# Patient Record
Sex: Male | Born: 1975 | Race: Black or African American | Hispanic: No | Marital: Married | Smoking: Never smoker
Health system: Southern US, Community
[De-identification: ages and names within clinical notes are randomized; demographics above are authoritative.]

---

## 2007-05-09 ENCOUNTER — Ambulatory Visit: Payer: Self-pay | Admitting: Cardiology

## 2007-05-09 LAB — CONVERTED CEMR LAB
Chloride: 103 meq/L (ref 96–112)
GFR calc non Af Amer: 83 mL/min
Potassium: 3.6 meq/L (ref 3.5–5.1)
Sodium: 141 meq/L (ref 135–145)

## 2007-05-17 ENCOUNTER — Encounter: Payer: Self-pay | Admitting: Cardiology

## 2007-05-17 ENCOUNTER — Ambulatory Visit: Payer: Self-pay

## 2007-06-03 ENCOUNTER — Ambulatory Visit: Payer: Self-pay | Admitting: Cardiology

## 2007-08-30 ENCOUNTER — Encounter (INDEPENDENT_AMBULATORY_CARE_PROVIDER_SITE_OTHER): Payer: Self-pay | Admitting: *Deleted

## 2007-08-30 ENCOUNTER — Ambulatory Visit: Payer: Self-pay | Admitting: Internal Medicine

## 2007-08-30 DIAGNOSIS — R21 Rash and other nonspecific skin eruption: Secondary | ICD-10-CM

## 2007-08-30 LAB — CONVERTED CEMR LAB
Albumin: 4.1 g/dL (ref 3.5–5.2)
Basophils Absolute: 0 10*3/uL (ref 0.0–0.1)
Bilirubin, Direct: 0.1 mg/dL (ref 0.0–0.3)
Cholesterol: 203 mg/dL (ref 0–200)
Creatinine, Ser: 1.1 mg/dL (ref 0.4–1.5)
Direct LDL: 139.3 mg/dL
Eosinophils Absolute: 0.1 10*3/uL (ref 0.0–0.6)
Glucose, Bld: 93 mg/dL (ref 70–99)
HCT: 43 % (ref 39.0–52.0)
Hemoglobin, Urine: NEGATIVE
Hemoglobin: 14.7 g/dL (ref 13.0–17.0)
Leukocytes, UA: NEGATIVE
MCHC: 34.2 g/dL (ref 30.0–36.0)
MCV: 82 fL (ref 78.0–100.0)
Monocytes Absolute: 0.2 10*3/uL (ref 0.2–0.7)
Neutrophils Relative %: 60.3 % (ref 43.0–77.0)
Potassium: 4.2 meq/L (ref 3.5–5.1)
RDW: 12.5 % (ref 11.5–14.6)
Sodium: 138 meq/L (ref 135–145)
TSH: 1.13 microintl units/mL (ref 0.35–5.50)
Total Bilirubin: 0.8 mg/dL (ref 0.3–1.2)
Urobilinogen, UA: 0.2 (ref 0.0–1.0)

## 2011-02-03 NOTE — Assessment & Plan Note (Signed)
[facility]CARE                            CARDIOLOGY OFFICE NOTE   Vincent Gonzalez, Vincent Gonzalez                       MRN:          [id number]  DATE:06/03/2007                            DOB:          05-Sep-1976    REASON FOR VISIT:  Followup cardiac testing.   HISTORY OF PRESENT ILLNESS:  I saw Vincent Gonzalez back in August.  His  history is detailed in the previous note.  He had a history of  intermittent chest discomfort and shortness of breath, although without  any major cardiac risk factors.  I referred him for testing including a  chest x-ray, which was normal, and a BMET demonstrating normal renal  function and potassium given some findings of borderline hypertension.  He had an echocardiogram which showed an ejection fraction of 50% and no  major wall motion abnormalities, or valvular abnormalities, as well as a  followup Myoview which showed no electrocardiographic changes consistent  with ischemia and normal perfusion.  The ejection fraction by that study  was gated at 40%, although this did not correlate with his  echocardiographic study and I wonder if it was accurate.  I reviewed the  results with the patient today.  His blood pressure is elevated on  today's check and today we talked about limiting salt intake and  establishing with a primary care physician for further followup of his  hypertension.   ALLERGIES:  No known drug allergies.   PRESENT MEDICATIONS:  Multivitamin.   REVIEW OF SYSTEMS:  As described in the History of Present Illness.   EXAMINATION:  Blood pressure 123/100, heart rate 58, weight is 199  pounds (last blood pressure 118/86).  The patient is comfortable and in  no acute distress.  No significant interval change in his examination.  This is outlined in the prior note.   IMPRESSION AND RECOMMENDATIONS:  1. Overall reassuring cardiac testing including no definite ischemia      by Myoview and a low normal ejection fraction of  50% with no focal      wall motion abnormalities by echocardiography.  Right ventricular      pressures were normal as well and there were no other major      valvular abnormalities.  It is not entirely clear that the      patient's symptoms are cardiac based on this testing, but I would      recommend establishing with a primary care Lashante Fryberger for better      followup of hypertension.  We talked about some basic diet and      lifestyle modification issues today, but I suspect that he may      ultimately need a medication for this.  His renal function is      normal on screening BMET as is his potassium.  Chest x-ray was also      normal.  It may be worth considering pulmonary function test at      some point if symptoms      persist.  We can plan to see him back on an as needed basis.  2. Will schedule a visit with our primary care division to establish      regular followup.     Jonelle Sidle, MD  Electronically Signed    SGM/MedQ  DD: 06/03/2007  DT: 06/03/2007  Job #: [id number]

## 2011-02-03 NOTE — Assessment & Plan Note (Signed)
[facility]CARE                            CARDIOLOGY OFFICE NOTE   Vincent Gonzalez, Vincent Gonzalez                       MRN:          [id number]  DATE:05/09/2007                            DOB:          01-Dec-1975    REFERRING PHYSICIAN:  Duke Salvia, MD, [facility]   REASON FOR VISIT:  Chest pain.   HISTORY OF PRESENT ILLNESS:  Vincent Gonzalez is a pleasant, 35 year old male  with no reported major medical conditions who presents with history of  recurrent episodes of chest pain over the last six months. He states  that he has experienced a heavy chest discomfort that has occurred  somewhat sporadically and associated with shortness of breath over the  last 6 months. He states that he notices this mainly when he is at work  but has had episodes at home without any clear precipitant. Typically  these episodes are mild to moderate and last approximately 5 minutes  without any specific intervention although he does state that he tries  to relax when these episodes occur. He has had no prior cardiovascular  testing. He was recently seen at an urgent care with a reported sinus  infection and was placed on amoxicillin and Tylenol sinus. He reports 2  sinus infections in the last 6 months. He otherwise had no specific  symptoms. He states that he has had borderline high blood pressure  noticed at work but does not carry a formal diagnosis of hypertension.  He has had no problems with cough, hemoptysis, fevers, chills,  breathlessness with activity or changes in his urine. His  electrocardiogram shows sinus rhythm with R primes in leads V1 and V2  and nonspecific ST-T wave changes. There is no old tracing for  comparison.   ALLERGIES:  No known drug allergies.   CURRENT MEDICATIONS:  Multivitamin, amoxicillin, Tylenol sinus.   PAST MEDICAL HISTORY:  As outlined above. The patient denies any  specific surgeries.   REVIEW OF SYSTEMS:  As described in the history of present  illness. He  has had a recent nonproductive cough, sinus congestion. He does have  seasonal allergies. He also reports a hernia.   FAMILY HISTORY:  Noncontributory for premature cardiovascular disease.  He states that his father is in his mid 26s  with diabetes. He has 2  healthy siblings. His sister-in-law works with Dr. Graciela Husbands in our practice  in the electrophysiology lab.   SOCIAL HISTORY:  The patient is married, he has 2 children. 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 He reports  is job is fairly strenuous requiring fairly consistent lifting, walking  and standing. He denies any tobacco or alcohol use. No illicit substance  use. He does not exercise regularly at this point.   PHYSICAL EXAMINATION:  VITAL SIGNS:  Blood pressure is 118/86, heart  rate is 66.  GENERAL:  This is a normally nourished-appearing male in no acute  distress.  HEENT:  Normal.  NECK:  Supple. No elevated jugular venous pressure or loud bruits. No  thyromegaly is noted.  LUNGS:  Clear  without labored breathing at rest.  CARDIAC:  Reveals a regular rate and rhythm. No loud murmur or S3  gallop. No pericardial rub.  ABDOMEN:  Soft, nontender, normal active bowel sounds, no bruits, no  obvious hepatomegaly.  EXTREMITIES:  Exhibit no significant edema.  SKIN:  Warm and dry. Some hypopigmented areas are noted.  MUSCULOSKELETAL:  No kyphosis is noted.  NEUROPSYCHIATRIC:  The patient is alert and oriented x3, affect is  normal.   IMPRESSION/RECOMMENDATIONS:  1. Six month history of intermittent chest pain and shortness of      breath as outlined in a 35 year old male with no other major      cardiac risk factors based on available information.      Electrocardiogram is nonspecific at this point. He has R primes in      leads V1 and V2, no obvious Brugada pattern, and only nonspecific      ST-T wave changes. He has had no palpitations, dizziness or      syncope. He does  report problems with sinus infection over the      last 6 months but otherwise no cough, hemoptysis or urinary      changes. He has also noted borderline high blood pressure during      checks at work. We discussed the situation today and I have      reviewed with him a number of issues including further diagnostic      testing for ischemic heart disease. We talked about both      noninvasive and invasive techniques today and he seems to be most      comfortable proceeding with a noninvasive test first. I therefore      plan an exercise Myoview and echocardiogram in the near future with      planned followup in the office to discuss the results. I would also      like a BMET to assess his renal function and a basic chest x-ray. I      did not begin any new medications today and will otherwise plan to      see him back to review the results. He was quite comfortable with      this.  2. Further plans to follow.     Jonelle Sidle, MD  Electronically Signed    SGM/MedQ  DD: 05/09/2007  DT: 05/10/2007  Job #: [id number]   cc:   Duke Salvia, MD, [facility]

## 2019-12-07 ENCOUNTER — Ambulatory Visit: Payer: Self-pay | Attending: Family

## 2019-12-07 DIAGNOSIS — Z23 Encounter for immunization: Secondary | ICD-10-CM

## 2019-12-07 NOTE — Progress Notes (Signed)
   Covid-19 Vaccination Clinic  Name:  Oron Westrup    MRN: [id number] DOB: 1975/10/15  12/07/2019  Mr. Capozzoli was observed post Covid-19 immunization for 15 minutes without incident. He was provided with Vaccine Information Sheet and instruction to access the V-Safe system.   Mr. Foskey was instructed to call 911 with any severe reactions post vaccine: Marland Kitchen Difficulty breathing  . Swelling of face and throat  . A fast heartbeat  . A bad rash all over body  . Dizziness and weakness   Immunizations Administered    Name Date Dose VIS Date Route   Moderna COVID-19 Vaccine 12/07/2019  3:16 PM 0.5 mL 08/22/2019 Intramuscular   Manufacturer: Moderna   Lot: [id number]   NDC: 61607-371-06

## 2020-01-09 ENCOUNTER — Ambulatory Visit: Payer: Self-pay | Attending: Family

## 2020-01-09 DIAGNOSIS — Z23 Encounter for immunization: Secondary | ICD-10-CM

## 2020-01-09 NOTE — Progress Notes (Signed)
   Covid-19 Vaccination Clinic  Name:  Vincent Gonzalez    MRN: [id number] DOB: Dec 23, 1975  01/09/2020  [name] was observed post Covid-19 immunization for 15 minutes without incident. He was provided with Vaccine Information Sheet and instruction to access the V-Safe system.   [name] was instructed to call 911 with any severe reactions post vaccine: Marland Kitchen Difficulty breathing  . Swelling of face and throat  . A fast heartbeat  . A bad rash all over body  . Dizziness and weakness   Immunizations Administered    Name Date Dose VIS Date Route   Moderna COVID-19 Vaccine 01/09/2020  3:27 PM 0.5 mL 08/2019 Intramuscular   Manufacturer: Moderna   Lot: [id number]   NDC: 34917-915-05

## 2020-04-06 ENCOUNTER — Emergency Department (HOSPITAL_BASED_OUTPATIENT_CLINIC_OR_DEPARTMENT_OTHER)
Admission: EM | Admit: 2020-04-06 | Discharge: 2020-04-06 | Disposition: A | Discharge status: Home / Self Care | Payer: BC Managed Care – PPO | Attending: Emergency Medicine | Admitting: Emergency Medicine

## 2020-04-06 ENCOUNTER — Other Ambulatory Visit: Payer: Self-pay

## 2020-04-06 ENCOUNTER — Encounter (HOSPITAL_BASED_OUTPATIENT_CLINIC_OR_DEPARTMENT_OTHER): Payer: Self-pay

## 2020-04-06 DIAGNOSIS — M25511 Pain in right shoulder: Secondary | ICD-10-CM | POA: Insufficient documentation

## 2020-04-06 DIAGNOSIS — M542 Cervicalgia: Secondary | ICD-10-CM | POA: Diagnosis not present

## 2020-04-06 DIAGNOSIS — M545 Low back pain: Secondary | ICD-10-CM | POA: Insufficient documentation

## 2020-04-06 DIAGNOSIS — M25512 Pain in left shoulder: Secondary | ICD-10-CM | POA: Diagnosis not present

## 2020-04-06 MED ORDER — NAPROXEN 500 MG PO TABS: 500.0000 mg | ORAL_TABLET | Freq: Two times a day (BID) | ORAL | 0 refills | Status: DC

## 2020-04-06 MED ORDER — METHOCARBAMOL 500 MG PO TABS: 500.0000 mg | ORAL_TABLET | Freq: Two times a day (BID) | ORAL | 0 refills | Status: DC
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
## 2020-07-22 ENCOUNTER — Encounter (HOSPITAL_BASED_OUTPATIENT_CLINIC_OR_DEPARTMENT_OTHER): Payer: Self-pay | Admitting: *Deleted

## 2020-07-22 ENCOUNTER — Emergency Department (HOSPITAL_BASED_OUTPATIENT_CLINIC_OR_DEPARTMENT_OTHER)
Admission: EM | Admit: 2020-07-22 | Discharge: 2020-07-22 | Disposition: A | Discharge status: Home / Self Care | Payer: BC Managed Care – PPO | Attending: Emergency Medicine | Admitting: Emergency Medicine

## 2020-07-22 ENCOUNTER — Emergency Department (HOSPITAL_BASED_OUTPATIENT_CLINIC_OR_DEPARTMENT_OTHER): Payer: BC Managed Care – PPO

## 2020-07-22 ENCOUNTER — Other Ambulatory Visit: Payer: Self-pay

## 2020-07-22 DIAGNOSIS — M62838 Other muscle spasm: Secondary | ICD-10-CM | POA: Diagnosis not present

## 2020-07-22 DIAGNOSIS — M542 Cervicalgia: Secondary | ICD-10-CM | POA: Diagnosis present

## 2020-07-22 MED ORDER — NAPROXEN 500 MG PO TABS: 500.0000 mg | ORAL_TABLET | Freq: Two times a day (BID) | ORAL | 0 refills | Status: AC

## 2020-07-22 MED ORDER — METHOCARBAMOL 500 MG PO TABS: 500.0000 mg | ORAL_TABLET | Freq: Two times a day (BID) | ORAL | 0 refills | Status: AC

## 2020-07-22 NOTE — ED Triage Notes (Signed)
Pain in his neck and both shoulders while on zoom call today. Turning his head side to side increases the pain. He is ambulatory.
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
## 2020-07-22 NOTE — Discharge Instructions (Addendum)
Take naproxen 2 times a day with meals.  Do not take other anti-inflammatories at the same time (Advil, Motrin, ibuprofen, Aleve). You may supplement with Tylenol if you need further pain control. Use Robaxin as needed for muscle stiffness or soreness. Have caution, as this may make you tired or groggy. Do not drive or operate heavy machinery while taking this medication.  Use muscle creams (bengay, icy hot, salonpas) as needed for pain.  Follow up with your primary care doctor if pain is not improving with this treatment.  Return to the ER if you develop high fevers, numbness, or any new or concerning symptoms.

## 2020-09-10 ENCOUNTER — Ambulatory Visit: Payer: BC Managed Care – PPO | Attending: Critical Care Medicine

## 2020-09-10 DIAGNOSIS — Z23 Encounter for immunization: Secondary | ICD-10-CM

## 2020-09-10 NOTE — Progress Notes (Signed)
   [facility]  Name:  Vincent Gonzalez    MRN: [id number] DOB: 1976-02-27  09/10/2020  [name] was observed post Covid-19 immunization for 15 minutes without incident. He was provided with Vaccine Information Sheet and instruction to access the V-Safe system.   [name] was instructed to call 911 with any severe reactions post vaccine: Marland Kitchen Difficulty breathing  . Swelling of face and throat  . A fast heartbeat  . A bad rash all over body  . Dizziness and weakness   Immunizations Administered    Name Date Dose VIS Date Route   Moderna Covid-19 Booster Vaccine 09/10/2020  4:21 PM 0.25 mL 07/10/2020 Intramuscular   Manufacturer: Moderna   Lot: [id number]   NDC: 63846-659-93

## 2021-05-21 IMAGING — CT CT CERVICAL SPINE W/O CM
3 of 4 series · 10 of 33 positions shown, 12 images · non-contrast
Comparison: None.

CLINICAL DATA: Neck pain following Zoom call, initial encounter

EXAM:
CT CERVICAL SPINE WITHOUT CONTRAST
TECHNIQUE: Multidetector CT imaging of the cervical spine was performed without
intravenous contrast. Multiplanar CT image reconstructions were also
generated.

[Series 5: sagittal bone · sagittal · 0.23mm/px · 5 of 47 slices shown, 6 images]
[im 16/47  bone]
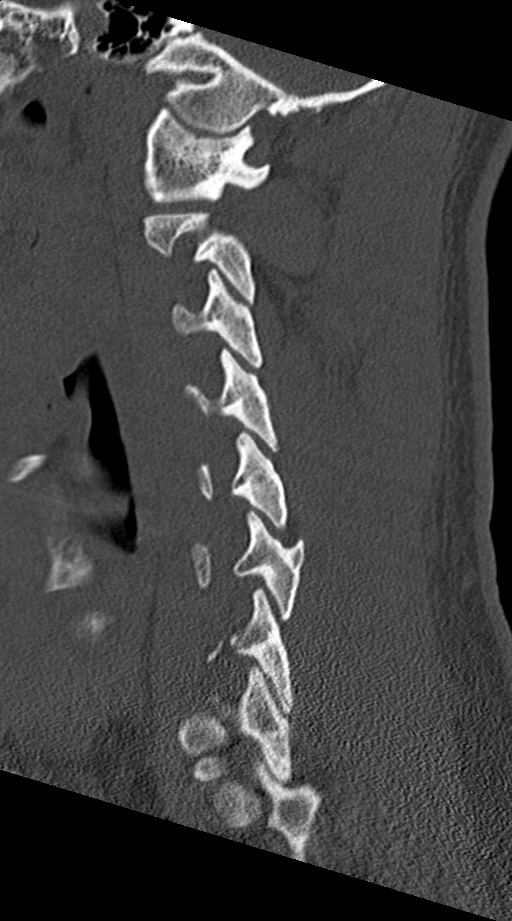
[im 20/47  bone]
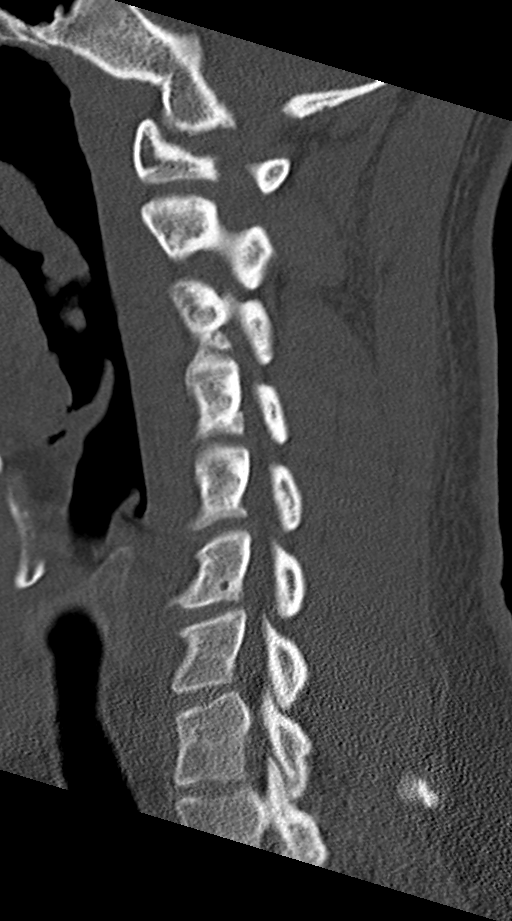
[im 24/47  soft-tissue]
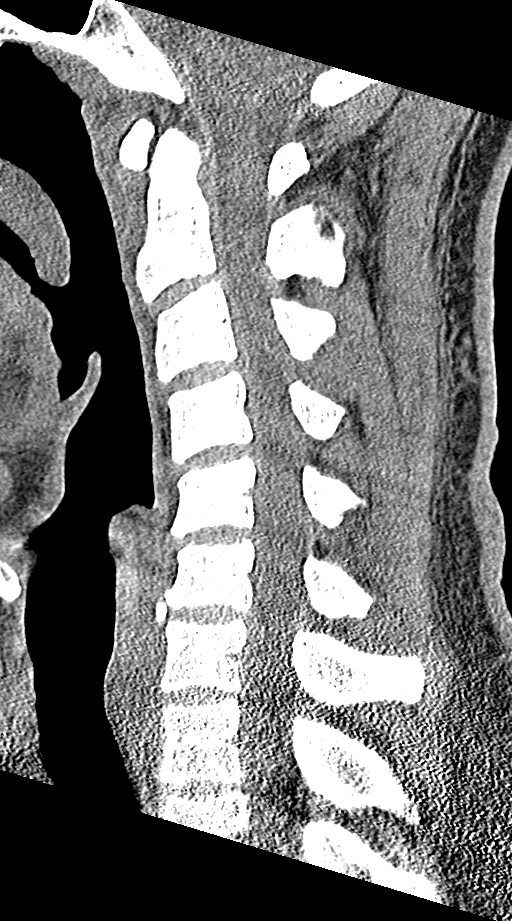
[im 24/47  bone]
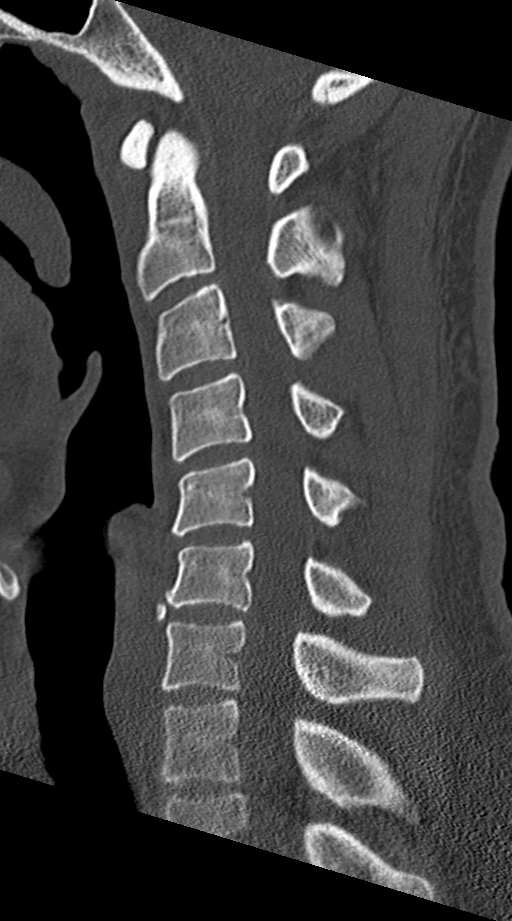
[im 27/47  bone]
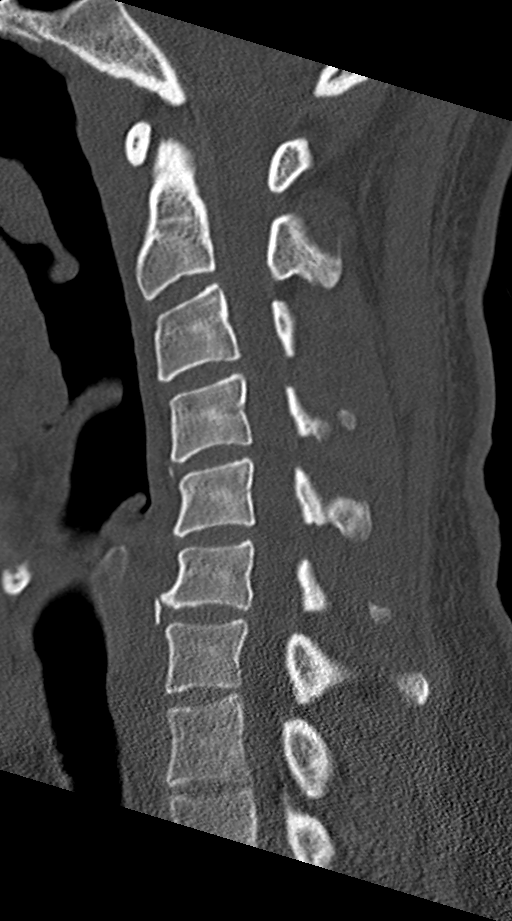
[im 31/47  bone]
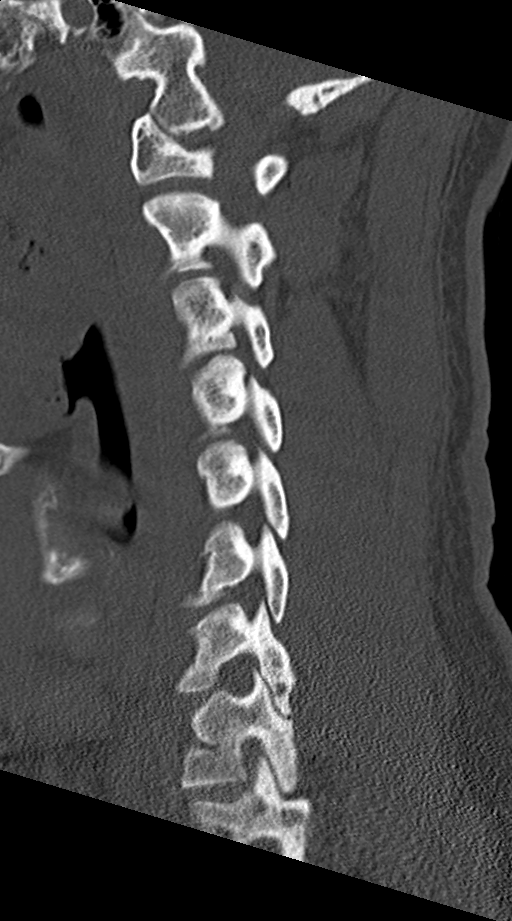

[Series 6: coronal bone · coronal · 0.24mm/px · 3 of 49 slices shown]
[im 10/49  bone]
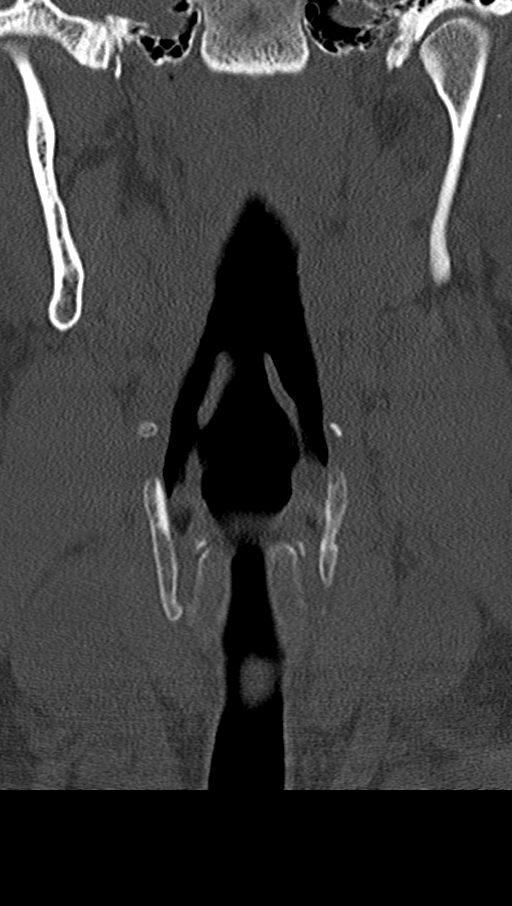
[im 20/49  bone]
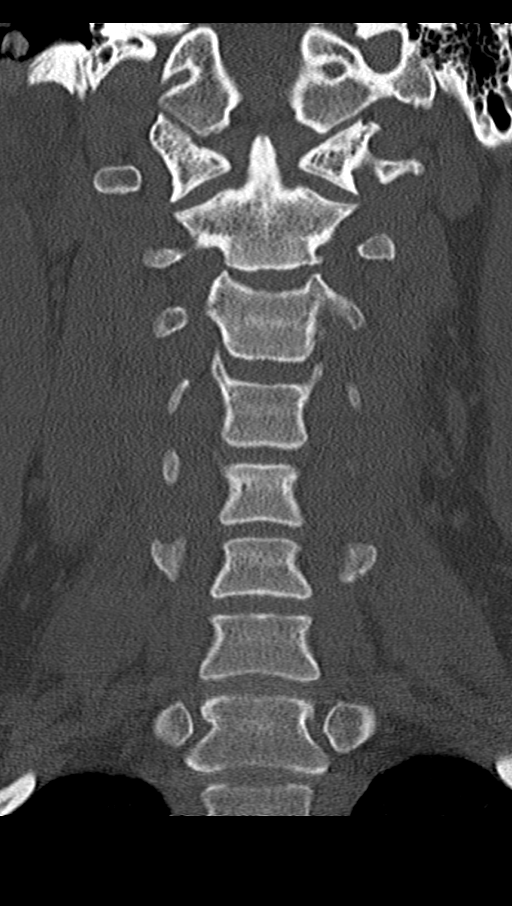
[im 29/49  bone]
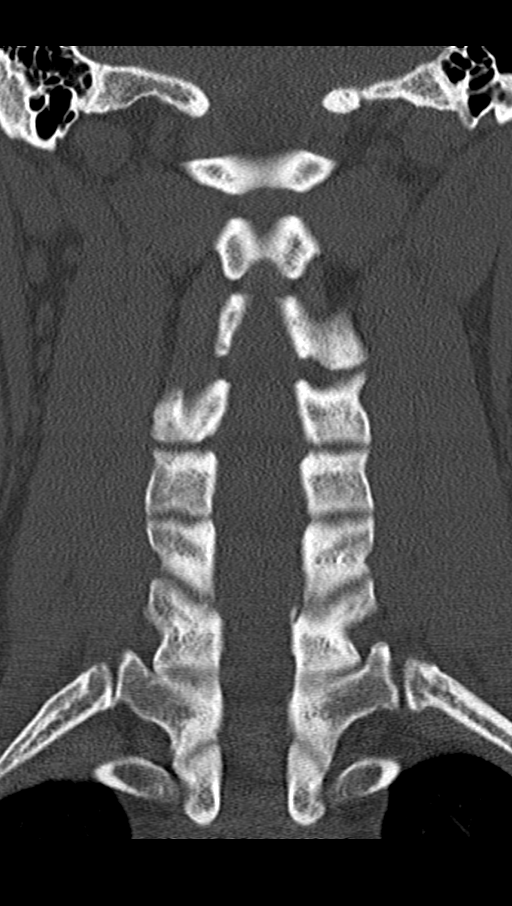

[Series 7: orthogonal bone · axial · 0.18mm/px · z∈[+1176,+1264]mm · 2 of 108 slices shown, 3 images]
[im 31/108  soft-tissue]
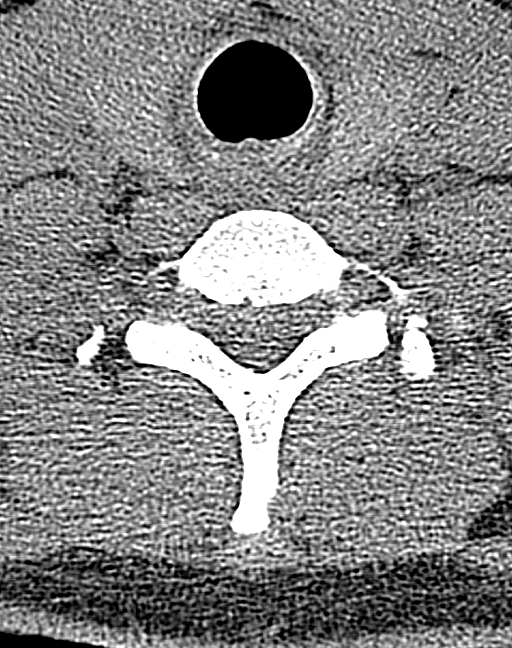
[im 31/108  bone]
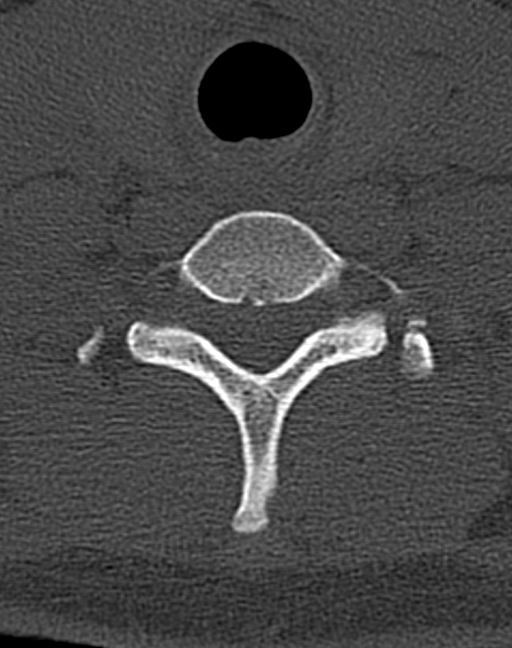
[im 77/108  bone]
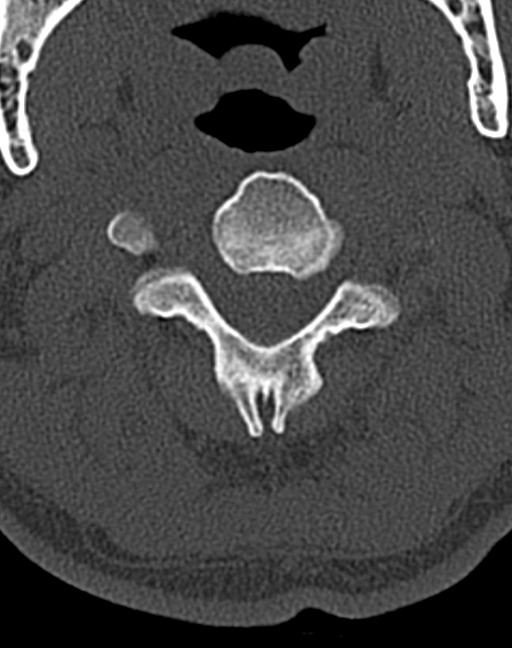

[10 of 33 positions shown; findings below may reference images not displayed]

FINDINGS: Alignment: Mild loss of the normal cervical lordosis is noted which
may be related to muscular spasm.

Skull base and vertebrae: 7 cervical segments are well visualized.
Vertebral body height is well maintained. Mild osteophytic changes
are noted at C5-6. No acute fracture or acute facet abnormality is
noted. Mild neural foraminal narrowing is noted bilaterally at C3-4.
No acute fracture or acute facet abnormality is noted. The odontoid
is within normal limits.

Soft tissues and spinal canal: Surrounding soft tissue structures
are within normal limits.

Upper chest: Visualized lung apices are unremarkable.

Other: None
IMPRESSION: Mild straightening of the normal cervical lordosis which may be
related to muscular spasm.

Very mild neural foraminal narrowing at C3-4 bilaterally. No other
focal abnormality is noted.

## 2023-09-06 ENCOUNTER — Ambulatory Visit
Admission: RE | Admit: 2023-09-06 | Discharge: 2023-09-06 | Disposition: A | Discharge status: Home / Self Care | Payer: BC Managed Care – PPO | Source: Ambulatory Visit | Attending: Family | Admitting: Family

## 2023-09-06 ENCOUNTER — Other Ambulatory Visit: Payer: Self-pay | Admitting: Family

## 2023-09-06 DIAGNOSIS — M25562 Pain in left knee: Secondary | ICD-10-CM

## 2023-12-09 ENCOUNTER — Encounter: Payer: Self-pay | Admitting: Family
